# Patient Record
Sex: Male | Born: 1975 | Race: White | Hispanic: No | Marital: Single | Smoking: Current every day smoker
Health system: Southern US, Community
[De-identification: ages and names within clinical notes are randomized; demographics above are authoritative.]

## PROBLEM LIST (undated history)

## (undated) DIAGNOSIS — F329 Major depressive disorder, single episode, unspecified: Secondary | ICD-10-CM

## (undated) DIAGNOSIS — F32A Depression, unspecified: Secondary | ICD-10-CM

## (undated) DIAGNOSIS — F319 Bipolar disorder, unspecified: Secondary | ICD-10-CM

## (undated) DIAGNOSIS — F1910 Other psychoactive substance abuse, uncomplicated: Secondary | ICD-10-CM

## (undated) HISTORY — PX: BACK SURGERY: SHX140

---

## 2007-07-31 ENCOUNTER — Ambulatory Visit: Payer: Self-pay | Admitting: *Deleted

## 2007-07-31 ENCOUNTER — Ambulatory Visit (HOSPITAL_BASED_OUTPATIENT_CLINIC_OR_DEPARTMENT_OTHER): Admission: RE | Admit: 2007-07-31 | Discharge: 2007-07-31 | Payer: Self-pay | Admitting: *Deleted

## 2007-07-31 DIAGNOSIS — M25519 Pain in unspecified shoulder: Secondary | ICD-10-CM

## 2007-07-31 DIAGNOSIS — J45909 Unspecified asthma, uncomplicated: Secondary | ICD-10-CM | POA: Insufficient documentation

## 2007-07-31 DIAGNOSIS — F17200 Nicotine dependence, unspecified, uncomplicated: Secondary | ICD-10-CM

## 2007-07-31 DIAGNOSIS — S42209A Unspecified fracture of upper end of unspecified humerus, initial encounter for closed fracture: Secondary | ICD-10-CM | POA: Insufficient documentation

## 2009-08-11 IMAGING — CR DG SHOULDER 2+V*R*
3 series · 3 of 3 positions shown · non-contrast
Comparison: None

CLINICAL DATA: Right shoulder pain after fall from horse.

RIGHT SHOULDER - 2+ VIEW

[w shoulder ap internal righ]
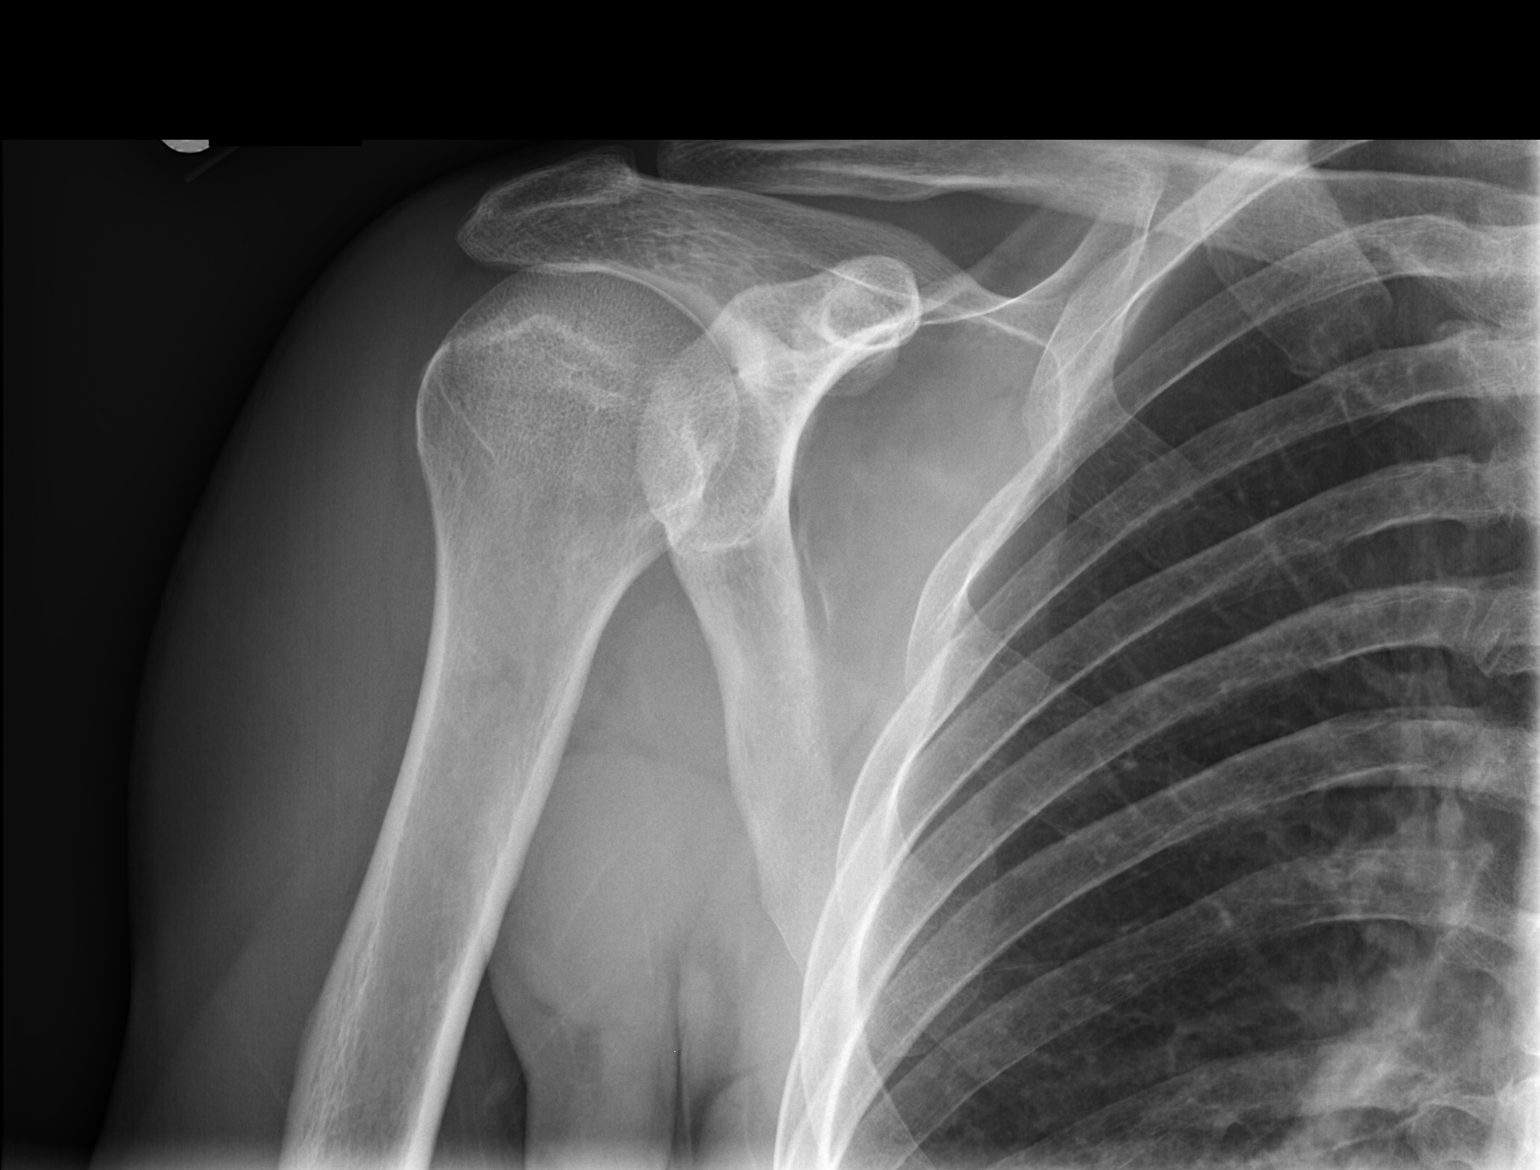

[w shoulder ap external righ]
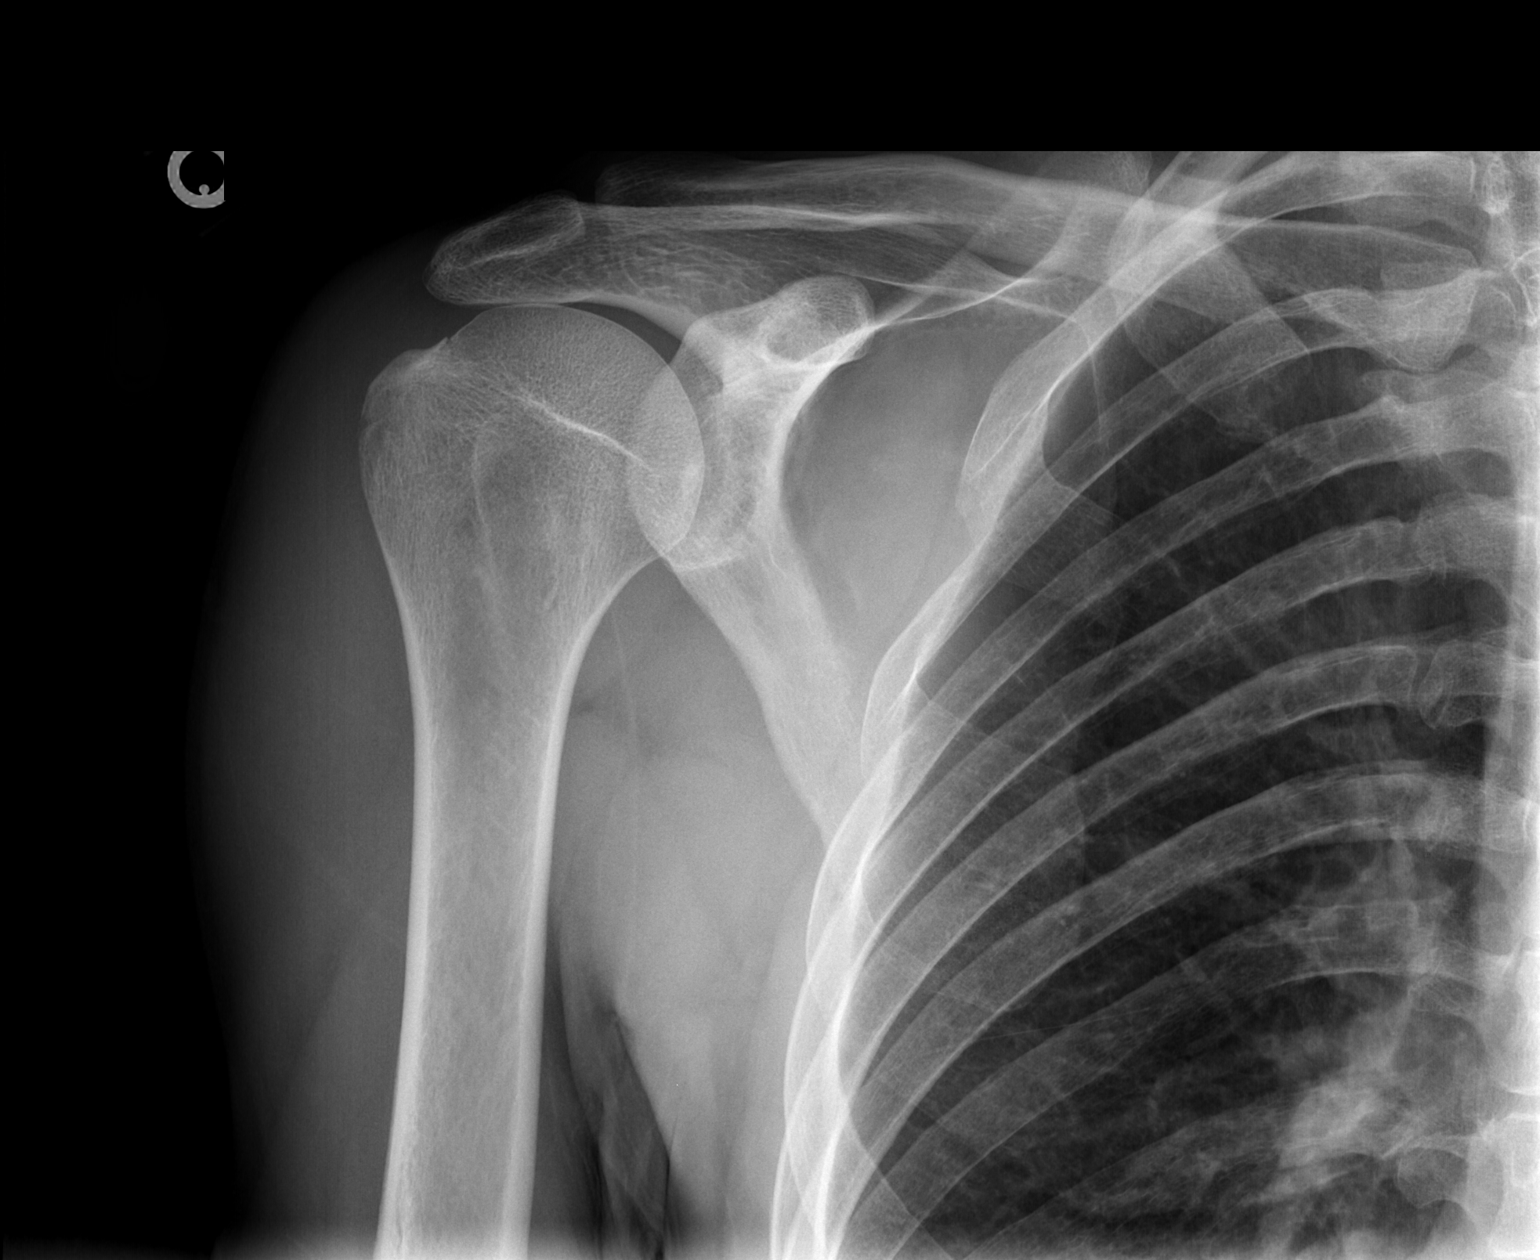

[w shoulder y view right]
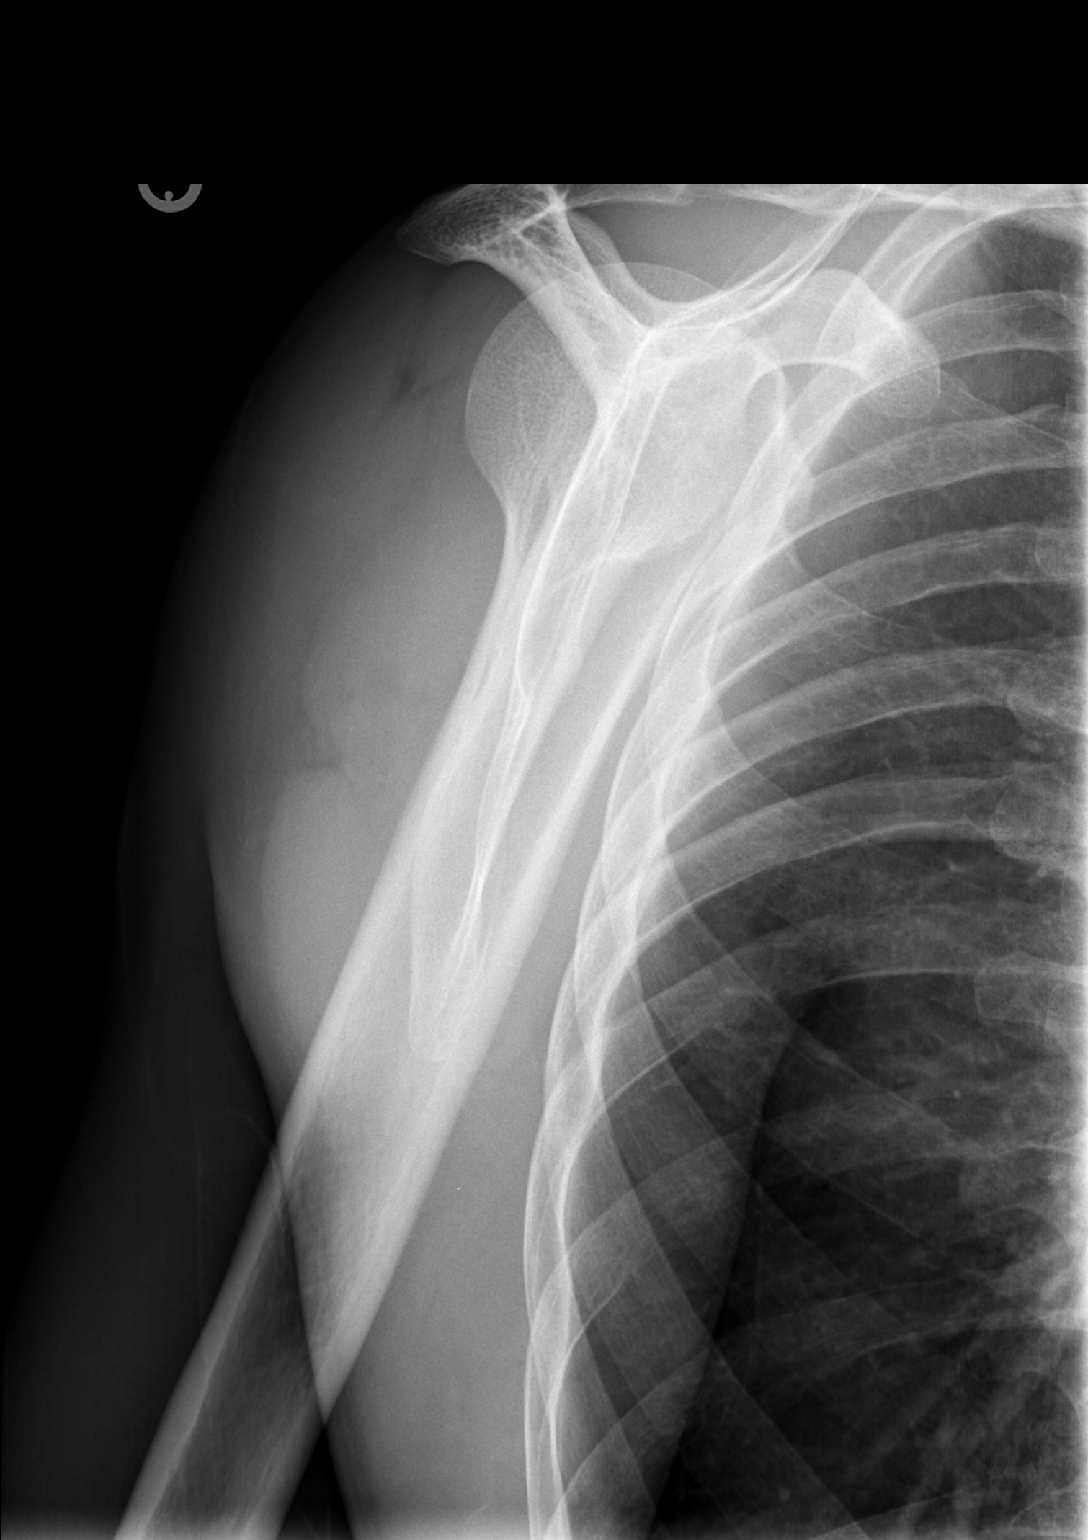

[3 of 3 positions shown; findings below may reference images not displayed]

FINDINGS: There is a vertically oriented lucency along the greater
tuberosity, with overlying soft tissue swelling.  No dislocation.
Acromioclavicular joint is intact.  Visualized right hemithorax is
unremarkable.
IMPRESSION: Nondisplaced right greater tuberosity fracture with overlying soft
tissue swelling.

## 2011-08-25 ENCOUNTER — Encounter (HOSPITAL_COMMUNITY): Payer: Self-pay | Admitting: Emergency Medicine

## 2011-08-25 ENCOUNTER — Emergency Department (HOSPITAL_COMMUNITY)
Admission: EM | Admit: 2011-08-25 | Discharge: 2011-08-26 | Discharge status: Against Medical Advice | Payer: Self-pay | Attending: Emergency Medicine | Admitting: Emergency Medicine

## 2011-08-25 DIAGNOSIS — Z0389 Encounter for observation for other suspected diseases and conditions ruled out: Secondary | ICD-10-CM | POA: Insufficient documentation

## 2011-08-25 HISTORY — DX: Depression, unspecified: F32.A

## 2011-08-25 HISTORY — DX: Other psychoactive substance abuse, uncomplicated: F19.10

## 2011-08-25 HISTORY — DX: Bipolar disorder, unspecified: F31.9

## 2011-08-25 HISTORY — DX: Major depressive disorder, single episode, unspecified: F32.9

## 2011-08-25 MED ORDER — IBUPROFEN 600 MG PO TABS
ORAL_TABLET | ORAL | Status: AC
  Filled 2011-08-25: qty 1

## 2011-08-25 NOTE — ED Notes (Signed)
Pt presenting to ed from [facility] for medical clearance with ivc paperwork. Pt states SI. Pt states he tried to cut his arm up and he wanted to jump off a bridge in front of a car. Pt with bandage to left wrist area

## 2011-08-26 MED ORDER — ACETAMINOPHEN 325 MG PO TABS
ORAL_TABLET | ORAL | Status: AC
  Filled 2011-08-26: qty 2

## 2011-08-26 MED ORDER — KETOROLAC TROMETHAMINE 60 MG/2ML IM SOLN
INTRAMUSCULAR | Status: AC
  Filled 2011-08-26: qty 2

## 2011-08-26 MED ORDER — IBUPROFEN 600 MG PO TABS
ORAL_TABLET | ORAL | Status: AC
  Filled 2011-08-26: qty 1

## 2011-08-26 MED ORDER — LORAZEPAM 1 MG PO TABS
ORAL_TABLET | ORAL | Status: AC
  Filled 2011-08-26: qty 1

## 2011-08-26 MED ORDER — NICOTINE 21 MG/24HR TD PT24
MEDICATED_PATCH | TRANSDERMAL | Status: AC
  Filled 2011-08-26: qty 1

## 2011-09-20 ENCOUNTER — Emergency Department (HOSPITAL_COMMUNITY)
Admission: EM | Admit: 2011-09-20 | Discharge: 2011-09-20 | Disposition: A | Discharge status: Home / Self Care | Payer: Self-pay | Attending: Emergency Medicine | Admitting: Emergency Medicine

## 2011-09-20 ENCOUNTER — Encounter (HOSPITAL_COMMUNITY): Payer: Self-pay

## 2011-09-20 DIAGNOSIS — F1010 Alcohol abuse, uncomplicated: Secondary | ICD-10-CM

## 2011-09-20 DIAGNOSIS — F1410 Cocaine abuse, uncomplicated: Secondary | ICD-10-CM

## 2011-09-20 DIAGNOSIS — F3289 Other specified depressive episodes: Secondary | ICD-10-CM | POA: Insufficient documentation

## 2011-09-20 DIAGNOSIS — F17200 Nicotine dependence, unspecified, uncomplicated: Secondary | ICD-10-CM | POA: Insufficient documentation

## 2011-09-20 DIAGNOSIS — F329 Major depressive disorder, single episode, unspecified: Secondary | ICD-10-CM | POA: Insufficient documentation

## 2011-09-20 LAB — CBC WITH DIFFERENTIAL/PLATELET
Basophils Relative: 1 % (ref 0–1)
HCT: 41.4 % (ref 39.0–52.0)
Hemoglobin: 14.7 g/dL (ref 13.0–17.0)
Lymphocytes Relative: 45 % (ref 12–46)
Lymphs Abs: 4.2 10*3/uL — ABNORMAL HIGH (ref 0.7–4.0)
Monocytes Absolute: 0.9 10*3/uL (ref 0.1–1.0)
Monocytes Relative: 10 % (ref 3–12)
Neutro Abs: 3.9 10*3/uL (ref 1.7–7.7)
Neutrophils Relative %: 42 % — ABNORMAL LOW (ref 43–77)
RBC: 4.53 MIL/uL (ref 4.22–5.81)
WBC: 9.3 10*3/uL (ref 4.0–10.5)

## 2011-09-20 LAB — COMPREHENSIVE METABOLIC PANEL
Albumin: 4.2 g/dL (ref 3.5–5.2)
Alkaline Phosphatase: 59 U/L (ref 39–117)
BUN: 5 mg/dL — ABNORMAL LOW (ref 6–23)
CO2: 26 mEq/L (ref 19–32)
Chloride: 99 mEq/L (ref 96–112)
Creatinine, Ser: 0.97 mg/dL (ref 0.50–1.35)
GFR calc non Af Amer: >90 mL/min (ref >90)
Potassium: 3.9 mEq/L (ref 3.5–5.1)
Total Bilirubin: 0.6 mg/dL (ref 0.3–1.2)

## 2011-09-20 LAB — RAPID URINE DRUG SCREEN, HOSP PERFORMED
Amphetamines: NOT DETECTED
Tetrahydrocannabinol: NOT DETECTED

## 2011-09-20 LAB — ETHANOL: Alcohol, Ethyl (B): 321 mg/dL — ABNORMAL HIGH (ref 0–11)

## 2011-09-20 MED ORDER — ONDANSETRON HCL 4 MG PO TABS: 4.0000 mg | ORAL_TABLET | Freq: Three times a day (TID) | ORAL | Status: DC | PRN

## 2011-09-20 MED ORDER — LORAZEPAM 1 MG PO TABS: 1.0000 mg | ORAL_TABLET | Freq: Three times a day (TID) | ORAL | Status: DC | PRN

## 2011-09-20 MED ORDER — NICOTINE 21 MG/24HR TD PT24
21.0000 mg | MEDICATED_PATCH | Freq: Once | TRANSDERMAL | Status: DC
  Administered 2011-09-20: 21 mg via TRANSDERMAL
  Filled 2011-09-20 (×2): qty 1

## 2011-09-20 MED ORDER — IBUPROFEN 600 MG PO TABS: 600.0000 mg | ORAL_TABLET | Freq: Three times a day (TID) | ORAL | Status: DC | PRN

## 2011-09-20 NOTE — ED Notes (Signed)
Report to Latricia RN in psych ED-ready for pt

## 2011-09-20 NOTE — Consult Note (Signed)
Reason for Consult: Cocaine intoxication, alcohol intoxication Referring Physician: ED physician  Jeffrey Conrad is an 36 y.o. male.  HPI: Patient was seen and chart reviewed.  The patient was brought in by [facility] police department with the involuntary commitment petition from his mother for threatening behavior while intoxicated. Patient reported he had an argument with his older brother who lives with his mother. Patient lives in a family property next to his mom's home. Patient reportedly works for a [employer] over several years. Patient was divorced, 9 years ago and has a 68 years old daughter who lives with her mother in [place], [place]. Patient has dated about a year ago but did not work out. Patient stated that he usually drinks 2 or 3 cans of beer a day. He went to the restaurant to eat out and found cocaine, where somebody have it and bought some ohhome and tried it out, and than he felt anxious and scared and started drinking beer to calm himself down, which ended up drinking 12 cans of beer at the time police came to his home. Patient has a fine scratches on his wrist secondary to hand cuffes. Patient denied symptoms of for depression, anxiety or psychosis. He denies alcohol dependence and the considered himself a social drinker. Patient stated it is a wrong decision to use cocaine and does not want to abuse it again. Patient contract for safety of himself and other including his family. Patient is worried about losing his job if he cannot work and difficult to find a job again. Patient father was passed away 3 weeks ago with throat cancer. Patient has a mild symptoms of sadness, but the denies being depressed.  Patient has no previous history of acute psychiatric hospitalization and medication management.   Past Medical History  Diagnosis Date  . Substance abuse   . Bipolar 1 disorder   . Depression     Past Surgical History  Procedure Date  . Back surgery     No family  history on file.  Social History:  reports that he has been smoking.  He does not have any smokeless tobacco history on file. He reports that he drinks alcohol. He reports that he uses illicit drugs (Cocaine).  Allergies: No Known Allergies  Medications: I have reviewed the patient's current medications.  Results for orders placed during the hospital encounter of 09/20/11 (from the past 48 hour(s))  CBC WITH DIFFERENTIAL     Status: Abnormal   Collection Time   09/20/11  1:00 AM      Component Value Range Comment   WBC 9.3  4.0 - 10.5 K/uL    RBC 4.53  4.22 - 5.81 MIL/uL    Hemoglobin 14.7  13.0 - 17.0 g/dL    HCT 62.9  52.8 - 41.3 %    MCV 91.4  78.0 - 100.0 fL    MCH 32.5  26.0 - 34.0 pg    MCHC 35.5  30.0 - 36.0 g/dL    RDW 24.4  01.0 - 27.2 %    Platelets 228  150 - 400 K/uL    Neutrophils Relative 42 (*) 43 - 77 %    Neutro Abs 3.9  1.7 - 7.7 K/uL    Lymphocytes Relative 45  12 - 46 %    Lymphs Abs 4.2 (*) 0.7 - 4.0 K/uL    Monocytes Relative 10  3 - 12 %    Monocytes Absolute 0.9  0.1 - 1.0 K/uL  Eosinophils Relative 2  0 - 5 %    Eosinophils Absolute 0.2  0.0 - 0.7 K/uL    Basophils Relative 1  0 - 1 %    Basophils Absolute 0.1  0.0 - 0.1 K/uL   COMPREHENSIVE METABOLIC PANEL     Status: Abnormal   Collection Time   09/20/11  1:00 AM      Component Value Range Comment   Sodium 137  135 - 145 mEq/L    Potassium 3.9  3.5 - 5.1 mEq/L    Chloride 99  96 - 112 mEq/L    CO2 26  19 - 32 mEq/L    Glucose, Bld 97  70 - 99 mg/dL    BUN 5 (*) 6 - 23 mg/dL    Creatinine, Ser 4.54  0.50 - 1.35 mg/dL    Calcium 8.7  8.4 - 09.8 mg/dL    Total Protein 7.0  6.0 - 8.3 g/dL    Albumin 4.2  3.5 - 5.2 g/dL    AST 33  0 - 37 U/L    ALT 18  0 - 53 U/L    Alkaline Phosphatase 59  39 - 117 U/L    Total Bilirubin 0.6  0.3 - 1.2 mg/dL    GFR calc non Af Amer >90  >90 mL/min    GFR calc Af Amer >90  >90 mL/min   ETHANOL     Status: Abnormal   Collection Time   09/20/11  1:00 AM       Component Value Range Comment   Alcohol, Ethyl (B) 321 (*) 0 - 11 mg/dL   URINE RAPID DRUG SCREEN (HOSP PERFORMED)     Status: Abnormal   Collection Time   09/20/11  1:37 AM      Component Value Range Comment   Opiates NONE DETECTED  NONE DETECTED    Cocaine POSITIVE (*) NONE DETECTED    Benzodiazepines NONE DETECTED  NONE DETECTED    Amphetamines NONE DETECTED  NONE DETECTED    Tetrahydrocannabinol NONE DETECTED  NONE DETECTED    Barbiturates NONE DETECTED  NONE DETECTED     No results found.  No anxiety, No psychosis and Positive for aggressive behavior, anxiety, bad mood, excessive alcohol consumption and illegal drug usage Blood pressure 120/74, pulse 73, temperature 97.9 F (36.6 C), temperature source Oral, resp. rate 18, height 5\' 6"  (1.676 m), weight 130 lb (58.968 kg), SpO2 97.00%.   Assessment/Plan: Alcohol intoxication. Cocaine intoxication. Family relationship problems.  Patient does not meet criteria for acute psychiatric hospitalization. Patient is contracting for safety of himself another fact remembers. Patient will be referred to the outpatient substance abuse services. Patient does not required any medication management. Patient involuntary commitment therapist will be rescinded.  Jeffrey Conrad,Jeffrey R. 09/20/2011, 4:52 PM

## 2011-09-20 NOTE — ED Notes (Signed)
Pt. Does not need Telepsych consult. Dr. Elsie Saas did a person-to-person consult with pt. In his room today.

## 2011-09-20 NOTE — ED Provider Notes (Signed)
History     CSN: [id number]  Arrival date & time 09/20/11  0024   None     Chief Complaint  Patient presents with  . Medical Clearance    (Consider location/radiation/quality/duration/timing/severity/associated sxs/prior treatment) HPI Comments: Patient brought by a Field seismologist under IVC papers that were drawn by his mother stating, that he was threatening his family and she felt unsafe.  He lives [place]-door.  Patient states that sheriff arrived to bring him.  He was asleep.  He, states he is not a danger to his family.  He has no animosity towards them.  He denies threatening them.  He drinks on a daily basis, and has no intention of stopping has no desire for detox  The history is provided by the patient.    Past Medical History  Diagnosis Date  . Substance abuse   . Bipolar 1 disorder   . Depression     Past Surgical History  Procedure Date  . Back surgery     No family history on file.  History  Substance Use Topics  . Smoking status: Current Everyday Smoker -- 1.5 packs/day  . Smokeless tobacco: Not on file  . Alcohol Use: Yes     daily      Review of Systems  Skin: Negative for wound.  Neurological: Negative for dizziness, weakness and numbness.  Psychiatric/Behavioral: Negative for suicidal ideas.    Allergies  Review of patient's allergies indicates no known allergies.  Home Medications  No current outpatient prescriptions on file.  BP 121/78  Pulse 98  Temp 98.2 F (36.8 C) (Oral)  Resp 20  Ht 5\' 6"  (1.676 m)  Wt 130 lb (58.968 kg)  BMI 20.98 kg/m2  SpO2 95%  Physical Exam  Constitutional: He appears well-developed and well-nourished.  HENT:  Head: Normocephalic.  Eyes: Pupils are equal, round, and reactive to light.  Neck: Normal range of motion.  Cardiovascular: Normal rate.   Pulmonary/Chest: Effort normal.  Musculoskeletal: Normal range of motion.  Neurological: He is alert.  Skin: Skin is warm.  Psychiatric: His mood appears  anxious.    ED Course  Procedures (including critical care time)  Labs Reviewed  CBC WITH DIFFERENTIAL - Abnormal; Notable for the following:    Neutrophils Relative 42 (*)     Lymphs Abs 4.2 (*)     All other components within normal limits  COMPREHENSIVE METABOLIC PANEL - Abnormal; Notable for the following:    BUN 5 (*)     All other components within normal limits  ETHANOL - Abnormal; Notable for the following:    Alcohol, Ethyl (B) 321 (*)     All other components within normal limits  URINE RAPID DRUG SCREEN (HOSP PERFORMED)   No results found.   No diagnosis found.    MDM  Will have tele psych evaluation         Arman Filter, NP 09/20/11 2008

## 2011-09-20 NOTE — ED Notes (Signed)
Kathreen Cornfield in to evaluate pt-Sherriff Deputies remain at bedside

## 2011-09-20 NOTE — ED Notes (Signed)
Pt presents with Sherriff Deputy's under IVC-per IVC papers-pt has been drinking all day and pt's Mother stated he was threatening his family members.  Pt is not cooperative.

## 2011-09-23 NOTE — ED Provider Notes (Signed)
Medical screening examination/treatment/procedure(s) were performed by non-physician practitioner and as supervising physician I was immediately available for consultation/collaboration.   Loralai Eisman L Geni Skorupski, MD 09/23/11 0641 

## 2013-02-17 ENCOUNTER — Encounter: Payer: Self-pay | Admitting: Emergency Medicine

## 2013-02-17 ENCOUNTER — Emergency Department
Admission: EM | Admit: 2013-02-17 | Discharge: 2013-02-17 | Disposition: A | Discharge status: Home / Self Care | Payer: BC Managed Care – PPO | Source: Home / Self Care | Attending: Family Medicine | Admitting: Family Medicine

## 2013-02-17 DIAGNOSIS — R21 Rash and other nonspecific skin eruption: Secondary | ICD-10-CM

## 2013-02-17 MED ORDER — CLOTRIMAZOLE 1 % EX CREA: 1.0000 "application " | TOPICAL_CREAM | Freq: Two times a day (BID) | CUTANEOUS | Status: AC

## 2013-02-17 NOTE — ED Notes (Signed)
Patient states he noticed a red blister on the top of left foot last night; this morning it has blister. Denies fever, chills.

## 2013-02-17 NOTE — ED Provider Notes (Signed)
CSN: [id number]     Arrival date & time 02/17/13  1129 History   First MD Initiated Contact with Patient 02/17/13 1139     Chief Complaint  Patient presents with  . Insect Bite    HPI HPI  This patient complains of a RASH  Location: L foot   Onset: 1 -2 days   Course: developed erythematous pruritic rash with border over past 2-3 days.   Self-treated with: nothing   Improvement with treatment: n/a  History  Itching: yes  Tenderness: no  New medications/antibiotics: no  Pet exposure: no  Recent travel or tropical exposure: no  New soaps, shampoos, detergent, clothing: no  Tick/insect exposure: no  Chemical Exposure: no  Red Flags  Feeling ill: no  Fever: no  Facial/tongue swelling/difficulty breathing: no  Diabetic or immunocompromised: no    Past Medical History  Diagnosis Date  . Substance abuse   . Bipolar 1 disorder   . Depression    Past Surgical History  Procedure Laterality Date  . Back surgery     History reviewed. No pertinent family history. History  Substance Use Topics  . Smoking status: Current Every Day Smoker -- 1.50 packs/day  . Smokeless tobacco: Not on file  . Alcohol Use: Yes     Comment: daily    Review of Systems  All other systems reviewed and are negative.    Allergies  Review of patient's allergies indicates no known allergies.  Home Medications   Current Outpatient Rx  Name  Route  Sig  Dispense  Refill  . clotrimazole (LOTRIMIN) 1 % cream   Topical   Apply 1 application topically 2 (two) times daily.   30 g   0    BP 126/77  Pulse 97  Temp(Src) 97.4 F (36.3 C) (Oral)  Resp 16  Ht 5\' 8"  (1.727 m)  Wt 132 lb 8 oz (60.102 kg)  BMI 20.15 kg/m2  SpO2 97% Physical Exam  Constitutional: He appears well-developed and well-nourished.  HENT:  Head: Normocephalic and atraumatic.  Eyes: Conjunctivae are normal. Pupils are equal, round, and reactive to light.  Neck: Normal range of motion. Neck supple.  Cardiovascular:  Normal rate and regular rhythm.   Pulmonary/Chest: Effort normal and breath sounds normal.  Abdominal: Soft.  Neurological: He is alert.  Skin: Rash noted.     1.5x1.5 cm well circumscribed erythematous lesion.  Non tender  Small central blistering     ED Course  Procedures (including critical care time) Labs Review Labs Reviewed  VIRAL CULTURE [facility]  WOUND CULTURE   Imaging Review No results found.    MDM   1. Rash and nonspecific skin eruption    DDx fairly broad including inflammatory, viral, fungal source.  Bacterial source less likely given morphology, pruritis.  Will send blister fluid for wound and viral culture.  Start on topical antifungal pending cultures.  Discussed derm and infectious red flags  Follow up pending cultures.     The patient and/or caregiver has been counseled thoroughly with regard to treatment plan and/or medications prescribed including dosage, schedule, interactions, rationale for use, and possible side effects and they verbalize understanding. Diagnoses and expected course of recovery discussed and will return if not improved as expected or if the condition worsens. Patient and/or caregiver verbalized understanding.         Doree AlbeeSteven Larance Ratledge, MD 03/08/13 1031

## 2013-02-19 LAB — WOUND CULTURE
GRAM STAIN: NONE SEEN
Gram Stain: NONE SEEN
Gram Stain: NONE SEEN
ORGANISM ID, BACTERIA: NO GROWTH

## 2013-02-26 LAB — VIRAL CULTURE VIRC: Organism ID, Bacteria: NEGATIVE

## 2013-03-07 ENCOUNTER — Telehealth: Payer: Self-pay | Admitting: *Deleted

## 2013-03-07 NOTE — ED Notes (Unsigned)
call back: Cell # VM not available. LM with male at home to call back for lab results.  Spoke to pt given viral cx and Wcx results. he reports that his foot is some better, he is working a lot with his boots rubbing the area. advised him to f/u with PCP or Derm if he fails to improve. Pt agrees. Clemens Catholichristy Kennita Pavlovich, LPN
